# Patient Record
Sex: Male | Born: 1965 | Race: Black or African American | Hispanic: No | Smoking: Current every day smoker
Health system: Southern US, Community
[De-identification: ages and names within clinical notes are randomized; demographics above are authoritative.]

## PROBLEM LIST (undated history)

## (undated) DIAGNOSIS — M1990 Unspecified osteoarthritis, unspecified site: Secondary | ICD-10-CM

## (undated) DIAGNOSIS — G709 Myoneural disorder, unspecified: Secondary | ICD-10-CM

## (undated) DIAGNOSIS — I10 Essential (primary) hypertension: Secondary | ICD-10-CM

## (undated) HISTORY — DX: Myoneural disorder, unspecified: G70.9

## (undated) HISTORY — PX: KNEE SURGERY: SHX244

## (undated) HISTORY — PX: FRACTURE SURGERY: SHX138

## (undated) HISTORY — DX: Unspecified osteoarthritis, unspecified site: M19.90

---

## 2013-08-13 ENCOUNTER — Encounter (HOSPITAL_COMMUNITY): Payer: Self-pay | Admitting: Emergency Medicine

## 2013-08-13 ENCOUNTER — Emergency Department (HOSPITAL_COMMUNITY): Payer: TRICARE For Life (TFL)

## 2013-08-13 ENCOUNTER — Emergency Department (HOSPITAL_COMMUNITY)
Admission: EM | Admit: 2013-08-13 | Discharge: 2013-08-13 | Disposition: A | Discharge status: Home / Self Care | Payer: TRICARE For Life (TFL) | Attending: Emergency Medicine | Admitting: Emergency Medicine

## 2013-08-13 DIAGNOSIS — Y9289 Other specified places as the place of occurrence of the external cause: Secondary | ICD-10-CM | POA: Insufficient documentation

## 2013-08-13 DIAGNOSIS — Y9389 Activity, other specified: Secondary | ICD-10-CM | POA: Insufficient documentation

## 2013-08-13 DIAGNOSIS — S61209A Unspecified open wound of unspecified finger without damage to nail, initial encounter: Secondary | ICD-10-CM | POA: Insufficient documentation

## 2013-08-13 DIAGNOSIS — S61409A Unspecified open wound of unspecified hand, initial encounter: Secondary | ICD-10-CM | POA: Insufficient documentation

## 2013-08-13 DIAGNOSIS — W268XXA Contact with other sharp object(s), not elsewhere classified, initial encounter: Secondary | ICD-10-CM | POA: Insufficient documentation

## 2013-08-13 DIAGNOSIS — F17200 Nicotine dependence, unspecified, uncomplicated: Secondary | ICD-10-CM | POA: Insufficient documentation

## 2013-08-13 DIAGNOSIS — Z23 Encounter for immunization: Secondary | ICD-10-CM | POA: Insufficient documentation

## 2013-08-13 DIAGNOSIS — I10 Essential (primary) hypertension: Secondary | ICD-10-CM | POA: Insufficient documentation

## 2013-08-13 DIAGNOSIS — S61218A Laceration without foreign body of other finger without damage to nail, initial encounter: Secondary | ICD-10-CM

## 2013-08-13 HISTORY — DX: Essential (primary) hypertension: I10

## 2013-08-13 MED ORDER — HYDROCODONE-ACETAMINOPHEN 5-325 MG PO TABS: 2.0000 | ORAL_TABLET | ORAL | Status: DC | PRN

## 2013-08-13 MED ORDER — AMOXICILLIN-POT CLAVULANATE 875-125 MG PO TABS: 1.0000 | ORAL_TABLET | Freq: Two times a day (BID) | ORAL | Status: DC

## 2013-08-13 MED ORDER — TETANUS-DIPHTH-ACELL PERTUSSIS 5-2.5-18.5 LF-MCG/0.5 IM SUSP
0.5000 mL | Freq: Once | INTRAMUSCULAR | Status: AC
  Administered 2013-08-13: 0.5 mL via INTRAMUSCULAR
  Filled 2013-08-13: qty 0.5

## 2013-08-13 MED ORDER — LIDOCAINE HCL (PF) 1 % IJ SOLN
30.0000 mL | Freq: Once | INTRAMUSCULAR | Status: DC
  Filled 2013-08-13: qty 30
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
## 2013-08-13 NOTE — ED Provider Notes (Signed)
Medical screening examination/treatment/procedure(s) were conducted as a shared visit with non-physician practitioner(s) and myself.  I personally evaluated the patient during the encounter.   EKG Interpretation None       Glynn OctaveStephen Jemaine Prokop, MD 08/13/13 1732

## 2013-08-13 NOTE — ED Notes (Signed)
Applied gauze to pt's finger and splinted finger

## 2013-08-13 NOTE — ED Notes (Signed)
Pt discharged home with all her belongings, pt alert, orient and ambulatory upon discharge, 2 new rx prescribed, pt verbalizes understanding of discharge instructions, pt driven home by friend at bedside
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
## 2013-08-13 NOTE — Discharge Instructions (Signed)
Laceration Care, Adult Call Dr. Janee Mornhompson today for an appointment tomorrow. Return to the ED if you develop new or worsening symptoms. A laceration is a cut or lesion that goes through all layers of the skin and into the tissue just beneath the skin. TREATMENT  Some lacerations may not require closure. Some lacerations may not be able to be closed due to an increased risk of infection. It is important to see your caregiver as soon as possible after an injury to minimize the risk of infection and maximize the opportunity for successful closure. If closure is appropriate, pain medicines may be given, if needed. The wound will be cleaned to help prevent infection. Your caregiver will use stitches (sutures), staples, wound glue (adhesive), or skin adhesive strips to repair the laceration. These tools bring the skin edges together to allow for faster healing and a better cosmetic outcome. However, all wounds will heal with a scar. Once the wound has healed, scarring can be minimized by covering the wound with sunscreen during the day for 1 full year. HOME CARE INSTRUCTIONS  For sutures or staples:  Keep the wound clean and dry.  If you were given a bandage (dressing), you should change it at least once a day. Also, change the dressing if it becomes wet or dirty, or as directed by your caregiver.  Wash the wound with soap and water 2 times a day. Rinse the wound off with water to remove all soap. Pat the wound dry with a clean towel.  After cleaning, apply a thin layer of the antibiotic ointment as recommended by your caregiver. This will help prevent infection and keep the dressing from sticking.  You may shower as usual after the first 24 hours. Do not soak the wound in water until the sutures are removed.  Only take over-the-counter or prescription medicines for pain, discomfort, or fever as directed by your caregiver.  Get your sutures or staples removed as directed by your caregiver. For skin  adhesive strips:  Keep the wound clean and dry.  Do not get the skin adhesive strips wet. You may bathe carefully, using caution to keep the wound dry.  If the wound gets wet, pat it dry with a clean towel.  Skin adhesive strips will fall off on their own. You may trim the strips as the wound heals. Do not remove skin adhesive strips that are still stuck to the wound. They will fall off in time. For wound adhesive:  You may briefly wet your wound in the shower or bath. Do not soak or scrub the wound. Do not swim. Avoid periods of heavy perspiration until the skin adhesive has fallen off on its own. After showering or bathing, gently pat the wound dry with a clean towel.  Do not apply liquid medicine, cream medicine, or ointment medicine to your wound while the skin adhesive is in place. This may loosen the film before your wound is healed.  If a dressing is placed over the wound, be careful not to apply tape directly over the skin adhesive. This may cause the adhesive to be pulled off before the wound is healed.  Avoid prolonged exposure to sunlight or tanning lamps while the skin adhesive is in place. Exposure to ultraviolet light in the first year will darken the scar.  The skin adhesive will usually remain in place for 5 to 10 days, then naturally fall off the skin. Do not pick at the adhesive film. You may need a tetanus shot  if:  You cannot remember when you had your last tetanus shot.  You have never had a tetanus shot. If you get a tetanus shot, your arm may swell, get red, and feel warm to the touch. This is common and not a problem. If you need a tetanus shot and you choose not to have one, there is a rare chance of getting tetanus. Sickness from tetanus can be serious. SEEK MEDICAL CARE IF:   You have redness, swelling, or increasing pain in the wound.  You see a red line that goes away from the wound.  You have yellowish-white fluid (pus) coming from the wound.  You have  a fever.  You notice a bad smell coming from the wound or dressing.  Your wound breaks open before or after sutures have been removed.  You notice something coming out of the wound such as wood or glass.  Your wound is on your hand or foot and you cannot move a finger or toe. SEEK IMMEDIATE MEDICAL CARE IF:   Your pain is not controlled with prescribed medicine.  You have severe swelling around the wound causing pain and numbness or a change in color in your arm, hand, leg, or foot.  Your wound splits open and starts bleeding.  You have worsening numbness, weakness, or loss of function of any joint around or beyond the wound.  You develop painful lumps near the wound or on the skin anywhere on your body. MAKE SURE YOU:   Understand these instructions.  Will watch your condition.  Will get help right away if you are not doing well or get worse. Document Released: 03/22/2005 Document Revised: 06/14/2011 Document Reviewed: 09/15/2010 [facility] Patient Information 2014 [place], [place].

## 2013-08-13 NOTE — ED Provider Notes (Signed)
I was asked by Dr. Manus Gunningancour to repair lacerations to the left index finger and left thumb.  No evidence of tendon involvement.  No obvious foreign body on visual exam and with exploration.  LACERATION REPAIR Performed by: Roxy Horsemanobert Kani Chauvin Authorized by: Roxy Horsemanobert Todd Argabright Consent: Verbal consent obtained. Risks and benefits: risks, benefits and alternatives were discussed Consent given by: patient Patient identity confirmed: provided demographic data Prepped and Draped in normal sterile fashion Wound explored  Laceration Location: left index  Laceration Length: 2 cm  No Foreign Bodies seen or palpated  Anesthesia: digital block  Local anesthetic: lidocaine 2% without epinephrine  Anesthetic total: 4 ml  Irrigation method: syringe Amount of cleaning: standard  Skin closure: 4-0 chromic absorbable  Number of sutures: 2   Technique: interrupted loose  Patient tolerance: Patient tolerated the procedure well with no immediate complications.  LACERATION REPAIR Performed by: Roxy Horsemanobert Zareena Willis Authorized by: Roxy Horsemanobert Hester Forget Consent: Verbal consent obtained. Risks and benefits: risks, benefits and alternatives were discussed Consent given by: patient Patient identity confirmed: provided demographic data Prepped and Draped in normal sterile fashion Wound explored  Laceration Location: left palm  Laceration Length: 1 cm  No Foreign Bodies seen or palpated  Anesthesia: local infiltration  Local anesthetic: lidocaine 2% without epinephrine  Anesthetic total: 2 ml  Irrigation method: syringe Amount of cleaning: standard  Skin closure: 4-0 chromic  Number of sutures: 2  Technique: interrupted  Patient tolerance: Patient tolerated the procedure well with no immediate complications.   Roxy Horsemanobert Taylie Helder, PA-C 08/13/13 [phone]

## 2013-10-18 ENCOUNTER — Ambulatory Visit (INDEPENDENT_AMBULATORY_CARE_PROVIDER_SITE_OTHER): Admitting: Family Medicine

## 2013-10-18 VITALS — BP 130/82 | HR 88 | Temp 98.7°F | Resp 20 | Ht 71.5 in | Wt 218.0 lb

## 2013-10-18 DIAGNOSIS — L02419 Cutaneous abscess of limb, unspecified: Secondary | ICD-10-CM

## 2013-10-18 DIAGNOSIS — L03119 Cellulitis of unspecified part of limb: Secondary | ICD-10-CM

## 2013-10-18 DIAGNOSIS — L03115 Cellulitis of right lower limb: Secondary | ICD-10-CM

## 2013-10-18 MED ORDER — AMOXICILLIN-POT CLAVULANATE 875-125 MG PO TABS: 1.0000 | ORAL_TABLET | Freq: Two times a day (BID) | ORAL | Status: DC

## 2013-10-18 MED ORDER — NAPROXEN 500 MG PO TABS: 500.0000 mg | ORAL_TABLET | Freq: Two times a day (BID) | ORAL | Status: DC

## 2013-10-18 NOTE — Progress Notes (Signed)
Subjective: For last couple days patient has developed redness and discomfort in his right lower extremity. Knows of no injury or sting. He has not had this before.  Objective: He has a cellulitis appearance in the right lower leg slightly medially, a little red and tender descending redness. I am not certain of being able to palpate a cord in the superficial veins. He does have mild right groin tenderness but no major enlargement of the nodes. No calf or medial thigh tenderness and negative Homans. There is an older appearing excoriations. It is healed almost.  Assessment: Cellulitis right leg. It could be a superficial phlebitis.  Plan: Augmentin Naproxen  Return if worse See instructions

## 2013-10-18 NOTE — Progress Notes (Deleted)
   Subjective:    Patient ID: Kenneth Vaughn, male    DOB: 1965/10/26, 48 y.o.   MRN: [id number]  HPI    Review of Systems     Objective:   Physical Exam        Assessment & Plan:

## 2013-10-18 NOTE — Patient Instructions (Signed)
Elevate the leg as much of the time as possible  Apply warm compress for 5 times daily for about 10 or 15 minutes  Take Augmentin(ampicillin/clavulanate) one twice daily for infection  Take naproxen one twice daily for pain and inflammation  Return if worse or not improving over the next few days

## 2013-11-05 ENCOUNTER — Other Ambulatory Visit: Payer: Self-pay | Admitting: Family Medicine

## 2013-11-06 NOTE — Telephone Encounter (Signed)
Do you want to give RF or RTC? 

## 2014-01-03 ENCOUNTER — Ambulatory Visit (INDEPENDENT_AMBULATORY_CARE_PROVIDER_SITE_OTHER): Admitting: Urgent Care

## 2014-01-03 VITALS — BP 132/84 | HR 92 | Temp 97.9°F | Resp 18 | Ht 71.0 in | Wt 218.0 lb

## 2014-01-03 DIAGNOSIS — H60391 Other infective otitis externa, right ear: Secondary | ICD-10-CM

## 2014-01-03 MED ORDER — NEOMYCIN-POLYMYXIN-HC 1 % OT SOLN
3.0000 [drp] | Freq: Four times a day (QID) | OTIC | Status: AC
Start: 2014-01-03 — End: 2014-01-10

## 2014-01-03 NOTE — Patient Instructions (Signed)
Please return to clinic if ear wick falls out before 3 days.   Otitis Externa Otitis externa is a bacterial or fungal infection of the outer ear canal. This is the area from the eardrum to the outside of the ear. Otitis externa is sometimes called "swimmer's ear." CAUSES  Possible causes of infection include:  Swimming in dirty water.  Moisture remaining in the ear after swimming or bathing.  Mild injury (trauma) to the ear.  Objects stuck in the ear (foreign body).  Cuts or scrapes (abrasions) on the outside of the ear. SIGNS AND SYMPTOMS  The first symptom of infection is often itching in the ear canal. Later signs and symptoms may include swelling and redness of the ear canal, ear pain, and yellowish-white fluid (pus) coming from the ear. The ear pain may be worse when pulling on the earlobe. DIAGNOSIS  Your health care provider will perform a physical exam. A sample of fluid may be taken from the ear and examined for bacteria or fungi. TREATMENT  Antibiotic ear drops are often given for 10 to 14 days. Treatment may also include pain medicine or corticosteroids to reduce itching and swelling. HOME CARE INSTRUCTIONS   Apply antibiotic ear drops to the ear canal as prescribed by your health care provider.  Take medicines only as directed by your health care provider.  If you have diabetes, follow any additional treatment instructions from your health care provider.  Keep all follow-up visits as directed by your health care provider. PREVENTION   Keep your ear dry. Use the corner of a towel to absorb water out of the ear canal after swimming or bathing.  Avoid scratching or putting objects inside your ear. This can damage the ear canal or remove the protective wax that lines the canal. This makes it easier for bacteria and fungi to grow.  Avoid swimming in lakes, polluted water, or poorly chlorinated pools.  You may use ear drops made of rubbing alcohol and vinegar after  swimming. Combine equal parts of white vinegar and alcohol in a bottle. Put 3 or 4 drops into each ear after swimming. SEEK MEDICAL CARE IF:   You have a fever.  Your ear is still red, swollen, painful, or draining pus after 3 days.  Your redness, swelling, or pain gets worse.  You have a severe headache.  You have redness, swelling, pain, or tenderness in the area behind your ear. MAKE SURE YOU:   Understand these instructions.  Will watch your condition.  Will get help right away if you are not doing well or get worse. Document Released: 03/22/2005 Document Revised: 08/06/2013 Document Reviewed: 04/08/2011 [facility] Patient Information 2015 [place], [place]. This information is not intended to replace advice given to you by your health care provider. Make sure you discuss any questions you have with your health care provider.

## 2014-01-03 NOTE — Progress Notes (Signed)
I was directly involved with the patient's care and agree with the physical, diagnosis and treatment plan.  

## 2014-01-03 NOTE — Progress Notes (Signed)
   Subjective:    Patient ID: Kenneth Vaughn, male    DOB: 15-Feb-1966, 48 y.o.   MRN: [id number]  HPI  Patient is a 48 y.o. male presenting for a 3 day history of progressively worsening right sided ear pain. Patient reports that his pain began without an inciting event, is constant and worse with palpation. Associated symptoms included right-sided hearing loss, tinnitus, and mild green-colored drainage at night (seen in the morning on a cotton ball he has been using at night), Patient also admits a slight headache and mild scratchy throat. He used ibuprofen x 1 without any relief, no other OTC medications. Denies fevers, chills, sinus pain, congestion, rhinorrhea, vision changes, cough. Of note, he has no been swimming recently, has not had any sick contacts and does not use Q-tips to clean his ears, uses cotton swabs. Denies any other aggravating or relieving factors.   Review of Systems As in subjective.    Objective:   Physical Exam  Vitals reviewed. Constitutional: He is oriented to person, place, and time. He appears well-developed and well-nourished. No distress.  BP 132/84  Pulse 92  Temp(Src) 97.9 F (36.6 C) (Oral)  Resp 18  Ht 5\' 11"  (1.803 m)  Wt 218 lb (98.884 kg)  BMI 30.42 kg/m2  SpO2 98%   HENT:  Head: Normocephalic and atraumatic.  Right Ear: There is drainage (mild, clear), swelling (TM not visualized secondary to swelling) and tenderness (tender with manipulation of tragus, pinna). Decreased hearing is noted.  Left Ear: Tympanic membrane, external ear and ear canal normal. No drainage.  Nose: Nose normal.  Mouth/Throat: Oropharynx is clear and moist. No oropharyngeal exudate.  Eyes: Conjunctivae are normal. Right eye exhibits no discharge. Left eye exhibits no discharge. No scleral icterus.  Pulmonary/Chest: Effort normal.  Neurological: He is alert and oriented to person, place, and time.  Skin: Skin is warm and dry.  Psychiatric: He has a normal mood and  affect. His behavior is normal.       Assessment & Plan:   1. Otitis, externa, infective, right Suspected bacterial infection, Rx Cortisporin HC w/ear wick placement; consider fungal etiology if symptoms not resolved/worsen  - NEOMYCIN-POLYMYXIN-HYDROCORTISONE (CORTISPORIN) 1 % SOLN otic solution; Place 3 drops into the right ear 4 (four) times daily.  Dispense: 10 mL; Refill: 0

## 2014-07-24 ENCOUNTER — Encounter (HOSPITAL_COMMUNITY): Payer: Self-pay | Admitting: Family Medicine

## 2014-07-24 ENCOUNTER — Emergency Department (INDEPENDENT_AMBULATORY_CARE_PROVIDER_SITE_OTHER)
Admission: EM | Admit: 2014-07-24 | Discharge: 2014-07-24 | Disposition: A | Discharge status: Home / Self Care | Payer: TRICARE For Life (TFL) | Source: Home / Self Care | Attending: Family Medicine | Admitting: Family Medicine

## 2014-07-24 DIAGNOSIS — Z872 Personal history of diseases of the skin and subcutaneous tissue: Secondary | ICD-10-CM

## 2014-07-24 DIAGNOSIS — L03317 Cellulitis of buttock: Secondary | ICD-10-CM | POA: Diagnosis not present

## 2014-07-24 MED ORDER — KETOROLAC TROMETHAMINE 60 MG/2ML IM SOLN
INTRAMUSCULAR | Status: AC
  Filled 2014-07-24: qty 2

## 2014-07-24 MED ORDER — KETOROLAC TROMETHAMINE 60 MG/2ML IM SOLN
60.0000 mg | Freq: Once | INTRAMUSCULAR | Status: AC
  Administered 2014-07-24: 60 mg via INTRAMUSCULAR

## 2014-07-24 MED ORDER — CEPHALEXIN 500 MG PO CAPS: 500.0000 mg | ORAL_CAPSULE | Freq: Three times a day (TID) | ORAL | Status: DC

## 2014-07-24 NOTE — ED Notes (Signed)
C/o pain on buttocks, poss abscess

## 2014-07-24 NOTE — ED Provider Notes (Signed)
CSN: [id number]     Arrival date & time 07/24/14  0909 History   First MD Initiated Contact with Patient 07/24/14 1022     Chief Complaint  Patient presents with  . Skin Problem   (Consider location/radiation/quality/duration/timing/severity/associated sxs/prior Treatment) HPI  3-4 days ago developed a painful bump on bottom. IN the middle. Tried warm compresses w/o relief. Getting worse. Constant. Unable to sleep. Hydrocodone w/o benefit. No drainage.  Denies fevers, CP, sob, palpitations, syncope, HA, abd pain, dysuria, frequency, back pain.   Numerous lumps under the skin throughout face back. Constant. Present for years. Continues to get new spots. Nontender, no discharge. Nothing makes these better.   Past Medical History  Diagnosis Date  . Hypertension   . Arthritis   . Neuromuscular disorder    Past Surgical History  Procedure Laterality Date  . Knee surgery      right and left  . Fracture surgery     Family History  Problem Relation Age of Onset  . Diabetes Mother   . Hypertension Father   . Diabetes Sister   . Diabetes Maternal Grandmother   . Stroke Maternal Grandmother   . Cancer Maternal Grandfather   . Stroke Paternal Grandmother   . Heart disease Paternal Grandfather    History  Substance Use Topics  . Smoking status: Current Every Day Smoker -- 1.00 packs/day    Types: Cigarettes  . Smokeless tobacco: Not on file  . Alcohol Use: Yes    Review of Systems Per HPI with all other pertinent systems negative.   Allergies  Review of patient's allergies indicates no known allergies.  Home Medications   Prior to Admission medications   Medication Sig Start Date End Date Taking? Authorizing Provider  cephALEXin (KEFLEX) 500 MG capsule Take 1 capsule (500 mg total) by mouth 3 (three) times daily. 07/24/14   Ozella Rocks, MD  meloxicam (MOBIC) 7.5 MG tablet Take 7.5 mg by mouth daily as needed for pain.    Historical Provider, MD   BP 141/98 mmHg   Pulse 115  Temp(Src) 98.4 F (36.9 C) (Oral)  Resp 16  SpO2 97% Physical Exam Physical Exam  Constitutional: oriented to person, place, and time. appears well-developed and well-nourished. No distress.  HENT:  Head: Normocephalic and atraumatic.  Eyes: EOMI. PERRL.  Neck: Normal range of motion.  Cardiovascular: RRR, no m/r/g, 2+ distal pulses,  Pulmonary/Chest: Effort normal and breath sounds normal. No respiratory distress.  Abdominal: Soft. Bowel sounds are normal. NonTTP, no distension.  Musculoskeletal: Normal range of motion. Non ttp, no effusion.  Neurological: alert and oriented to person, place, and time.  Skin: 7 x 4 cm area of indurated skin in the left proximal gluteal cleft without definitive area of fluctuance, no discharge. Very tender to palpation. Numerous pea-sized to 2.5 cm fluid-filled freely movable lesions throughout face, back, and arms.  Psychiatric: normal mood and affect. behavior is normal. Judgment and thought content normal.   ED Course  Procedures (including critical care time) Labs Review Labs Reviewed - No data to display  Imaging Review No results found.   MDM   1. Cellulitis of buttock, left   2. H/O sebaceous cyst    Toradol 60 mg IM administered for pain and inflammation. Start Keflex. Will treat the cellulitis for an extended period of time as there is concern for a possible abscess but nothing definitive to be up to drain at this time. Patient aware that if this therapy does not work  he will need to come back for possible incision and drainage. Again no appreciable area of fluctuance at this period of time. Patient inquiring as to the removal of numerous cysts. His has these done in the past. For an patient back to his primary care physician or dermatologist for these to be done. Reminded patient of the benign nature of these lesions and how he typically require some secondary competition such as infection or inflammation or pain noted at the  insurance to pay for the removal. Patient will follow up with his PCP as necessary.    Ozella Rocksavid J Merrell, MD 07/24/14 [phone]

## 2014-07-24 NOTE — Discharge Instructions (Signed)
You have developed a skin infection called cellulitis. It is unclear whether or not there is an abscess. Please start the antibiotics and take them for the full 10 days. If your infection comes back it will be because there was an abscess and this will need to be drained. Please continue the warm soaks until your pain has resolved or until your infection comes to a head with drainage. Please use ibuprofen or Advil for pain, but start this in 24 hours. Please follow-up with your primary care physician with regards to the numerous cysts in having them removed. Remember that in order for insurance to pay for these are typically needs to be evidence of infection, inflammation, or pain.

## 2014-11-25 ENCOUNTER — Emergency Department (HOSPITAL_BASED_OUTPATIENT_CLINIC_OR_DEPARTMENT_OTHER)
Admission: EM | Admit: 2014-11-25 | Discharge: 2014-11-25 | Disposition: A | Discharge status: Home / Self Care | Attending: Emergency Medicine | Admitting: Emergency Medicine

## 2014-11-25 ENCOUNTER — Emergency Department (HOSPITAL_BASED_OUTPATIENT_CLINIC_OR_DEPARTMENT_OTHER)

## 2014-11-25 ENCOUNTER — Encounter (HOSPITAL_BASED_OUTPATIENT_CLINIC_OR_DEPARTMENT_OTHER): Payer: Self-pay

## 2014-11-25 DIAGNOSIS — Z8739 Personal history of other diseases of the musculoskeletal system and connective tissue: Secondary | ICD-10-CM | POA: Insufficient documentation

## 2014-11-25 DIAGNOSIS — H00034 Abscess of left upper eyelid: Secondary | ICD-10-CM | POA: Diagnosis not present

## 2014-11-25 DIAGNOSIS — H02844 Edema of left upper eyelid: Secondary | ICD-10-CM | POA: Diagnosis present

## 2014-11-25 DIAGNOSIS — Z720 Tobacco use: Secondary | ICD-10-CM | POA: Diagnosis not present

## 2014-11-25 DIAGNOSIS — I10 Essential (primary) hypertension: Secondary | ICD-10-CM | POA: Diagnosis not present

## 2014-11-25 DIAGNOSIS — L0291 Cutaneous abscess, unspecified: Secondary | ICD-10-CM

## 2014-11-25 LAB — BASIC METABOLIC PANEL
Anion gap: 8 (ref 5–15)
BUN: 16 mg/dL (ref 6–20)
CO2: 24 mmol/L (ref 22–32)
Calcium: 9.1 mg/dL (ref 8.9–10.3)
Chloride: 104 mmol/L (ref 101–111)
Creatinine, Ser: 0.94 mg/dL (ref 0.61–1.24)
GFR calc Af Amer: >60 mL/min (ref >60)
GFR calc non Af Amer: >60 mL/min (ref >60)
GLUCOSE: 107 mg/dL — AB (ref 65–99)
Potassium: 3.9 mmol/L (ref 3.5–5.1)
Sodium: 136 mmol/L (ref 135–145)

## 2014-11-25 LAB — CBC WITH DIFFERENTIAL/PLATELET
BASOS PCT: 0 % (ref 0–1)
Basophils Absolute: 0 10*3/uL (ref 0.0–0.1)
EOS PCT: 4 % (ref 0–5)
Eosinophils Absolute: 0.5 10*3/uL (ref 0.0–0.7)
HCT: 42.6 % (ref 39.0–52.0)
Hemoglobin: 14.9 g/dL (ref 13.0–17.0)
Lymphocytes Relative: 18 % (ref 12–46)
Lymphs Abs: 2.1 10*3/uL (ref 0.7–4.0)
MCH: 30.8 pg (ref 26.0–34.0)
MCHC: 35 g/dL (ref 30.0–36.0)
MCV: 88 fL (ref 78.0–100.0)
MONO ABS: 1.5 10*3/uL — AB (ref 0.1–1.0)
Monocytes Relative: 13 % — ABNORMAL HIGH (ref 3–12)
Neutro Abs: 7.7 10*3/uL (ref 1.7–7.7)
Neutrophils Relative %: 65 % (ref 43–77)
PLATELETS: 178 10*3/uL (ref 150–400)
RBC: 4.84 MIL/uL (ref 4.22–5.81)
RDW: 13.2 % (ref 11.5–15.5)
WBC: 11.8 10*3/uL — ABNORMAL HIGH (ref 4.0–10.5)

## 2014-11-25 MED ORDER — ONDANSETRON HCL 8 MG PO TABS
8.0000 mg | ORAL_TABLET | Freq: Once | ORAL | Status: AC
  Administered 2014-11-25: 8 mg via ORAL
  Filled 2014-11-25: qty 1

## 2014-11-25 MED ORDER — SULFAMETHOXAZOLE-TRIMETHOPRIM 800-160 MG PO TABS
1.0000 | ORAL_TABLET | Freq: Once | ORAL | Status: AC
  Administered 2014-11-25: 1 via ORAL
  Filled 2014-11-25: qty 1

## 2014-11-25 MED ORDER — IOHEXOL 300 MG/ML  SOLN
100.0000 mL | Freq: Once | INTRAMUSCULAR | Status: AC | PRN
  Administered 2014-11-25: 100 mL via INTRAVENOUS

## 2014-11-25 MED ORDER — CEPHALEXIN 500 MG PO CAPS: 500.0000 mg | ORAL_CAPSULE | Freq: Four times a day (QID) | ORAL | Status: AC

## 2014-11-25 MED ORDER — HYDROMORPHONE HCL 1 MG/ML IJ SOLN
0.5000 mg | Freq: Once | INTRAMUSCULAR | Status: AC
  Administered 2014-11-25: 0.5 mg via INTRAVENOUS
  Filled 2014-11-25: qty 1

## 2014-11-25 NOTE — ED Provider Notes (Signed)
EMERGENCY DEPARTMENT US SOFT TISSUE INTERPRETATION "Study: Limited Ultrasound of the noted body part in comments below"  INDICATIONS: Soft tissue infection Multiple views of the body part are obtained with a multi-frequency linear probe  PERFORMED BY:  Myself  IMAGES ARCHIVED?: Yes  SIDE:Left  BODY PART: L upper eyelid  FINDINGS: Abcess present  LIMITATIONS:  none  INTERPRETATION:  Abcess present  COMMENT:     Renne Crigler, PA-C 11/25/14 1255  Gerhard Munch, MD 11/25/14 1527

## 2014-11-25 NOTE — ED Notes (Signed)
MD at bedside. 

## 2014-11-25 NOTE — ED Notes (Signed)
Swelling to left upper eye lid since 9/19-denies injury

## 2014-11-25 NOTE — ED Provider Notes (Signed)
CSN: [id number]     Arrival date & time 11/25/14  1059 History   First MD Initiated Contact with Patient 11/25/14 1105     Chief Complaint  Patient presents with  . Eye Problem     (Consider location/radiation/quality/duration/timing/severity/associated sxs/prior Treatment) HPI Comments: Pt is a 49 yo male who presents to the ED with complaint of left upper eyelid swelling, onset 3 days. Pt reports he first noticed 2 pustules and one small bump on his eyelid. He notes he tried to pop the bump but was unable to produce any drainage. He notes the swelling has worsened over the weekend and his eye is now swollen shut. Endorses matting of left eye. Pt has used bacitracin ointment at home without relief. He reports 9/10 pain to left eye. Denies fever, headache, abdominal pain, N/V, numbness, tingling. Denies known insect bite or injury to left eye.  Patient is a 49 y.o. male presenting with eye problem.  Eye Problem Associated symptoms: discharge     Past Medical History  Diagnosis Date  . Hypertension   . Arthritis   . Neuromuscular disorder    Past Surgical History  Procedure Laterality Date  . Knee surgery      right and left  . Fracture surgery     Family History  Problem Relation Age of Onset  . Diabetes Mother   . Hypertension Father   . Diabetes Sister   . Diabetes Maternal Grandmother   . Stroke Maternal Grandmother   . Cancer Maternal Grandfather   . Stroke Paternal Grandmother   . Heart disease Paternal Grandfather    Social History  Substance Use Topics  . Smoking status: Current Every Day Smoker -- 1.00 packs/day    Types: Cigarettes  . Smokeless tobacco: None  . Alcohol Use: Yes    Review of Systems  Eyes: Positive for pain and discharge.  All other systems reviewed and are negative.     Allergies  Review of patient's allergies indicates no known allergies.  Home Medications   Prior to Admission medications   Medication Sig Start Date End Date  Taking? Authorizing Provider  cephALEXin (KEFLEX) 500 MG capsule Take 1 capsule (500 mg total) by mouth 4 (four) times daily. 11/25/14   Satira Sark Rolande Moe, PA-C   BP 140/96 mmHg  Pulse 95  Temp(Src) 97.8 F (36.6 C) (Oral)  Resp 16  Ht 5\' 11"  (1.803 m)  Wt 220 lb (99.791 kg)  BMI 30.70 kg/m2 Physical Exam  Constitutional: He is oriented to person, place, and time. He appears well-developed and well-nourished.  HENT:  Head: Normocephalic and atraumatic.  Nose: Right sinus exhibits no maxillary sinus tenderness and no frontal sinus tenderness. Left sinus exhibits no maxillary sinus tenderness and no frontal sinus tenderness.  Eyes: Conjunctivae and EOM are normal. Pupils are equal, round, and reactive to light. No scleral icterus.  Moderate swelling to left upper eyelid, unable to visualize left eye completely due to amount of swelling, 2x1.5cm abscess with fluctuance and erythema noted to center of left upper eyelid, no drainage, chemosis at left conjunctiva. Unable to complete eye exam due to amount of swelling present. Swelling does not extend further than upper eyelid.  Pulmonary/Chest: Effort normal.  Neurological: He is alert and oriented to person, place, and time.    ED Course  Procedures (including critical care time) Labs Review Labs Reviewed  BASIC METABOLIC PANEL - Abnormal; Notable for the following:    Glucose, Bld 107 (*)    All  other components within normal limits  CBC WITH DIFFERENTIAL/PLATELET - Abnormal; Notable for the following:    WBC 11.8 (*)    Monocytes Relative 13 (*)    Monocytes Absolute 1.5 (*)    All other components within normal limits    Imaging Review Ct Orbits W/cm  11/25/2014   CLINICAL DATA:  Patient's left eye is read and swelling. Patient denies trauma or insect bites.  EXAM: CT ORBITS WITH CONTRAST  TECHNIQUE: Multidetector CT imaging of the orbits was performed following the bolus administration of intravenous contrast.  CONTRAST:   OMNIPAQUE IOHEXOL 300 MG/ML  SOLN  COMPARISON:  None.  FINDINGS: The globes and extraocular muscles appear symmetrical. There is soft tissue swelling overlying the left globe with small hypoattenuated fluid collection subcutaneously which measures approximately 6 mm in diameter. This likely represents a superficial abscess. There is no evidence of postseptal inflammatory changes.  No air fluid levels in the paranasal sinuses. There is mild polypoid mucosal thickening of the maxillary sinuses and ethmoid air cells bilaterally.  The frontal bones, orbital rims, maxillary antral walls, nasal bones, nasal septum, nasal spine, maxilla, pterygoid plates, zygomatic arches, temporomandibular joints, and mandible appear intact.  No displaced fractures are identified.  IMPRESSION: Soft tissue swelling with 6 mm subcutaneous abscess overlying the left globe. No evidence of postseptal abscess.  Minimal polypoid mucosal thickening of the maxillary sinuses and ethmoid air cells.   Electronically Signed   By: Ted Mcalpine M.D.   On: 11/25/2014 14:21   I have personally reviewed and evaluated these images and lab results as part of my medical decision-making.  Filed Vitals:   11/25/14 1111  BP: 140/96  Pulse: 95  Temp: 97.8 F (36.6 C)  Resp: 16   Meds given in ED:  Medications  sulfamethoxazole-trimethoprim (BACTRIM DS,SEPTRA DS) 800-160 MG per tablet 1 tablet (1 tablet Oral Given 11/25/14 1307)  HYDROmorphone (DILAUDID) injection 0.5 mg (0.5 mg Intravenous Given 11/25/14 1307)  ondansetron (ZOFRAN) tablet 8 mg (8 mg Oral Given 11/25/14 1307)  iohexol (OMNIPAQUE) 300 MG/ML solution 100 mL (100 mLs Intravenous Contrast Given 11/25/14 1349)    New Prescriptions   CEPHALEXIN (KEFLEX) 500 MG CAPSULE    Take 1 capsule (500 mg total) by mouth 4 (four) times daily.     MDM   Final diagnoses:  Abscess    Pt presents with worsening swelling to left upper eyelid. Denies fever.  US performed by Rhea Bleacher, PA-C. US revealed appx. 2x2cm abscess located at center of left upper eyelid. CT orbit with contrast ordered to evaluate for orbital cellulitis.  Pt reports increased pain. Pt given IV dilaudid for pain control. Abx started in ED, pt given bactrim.  CT reveals soft tissue swelling with 6mm subcutaneous abscess.  Ophthalmologist consulted. Advised to send pt to their office to be seen and to give pt rx for keflex.   Discussed results and plan for d/c and immediate follow up with Dr. Cathey Endow in their office this afternoon. Pt given rx for Keflex. Pt agrees with plan.    Satira Sark [place], [place] 11/25/14 1453  Gerhard Munch, MD 11/25/14 [phone]

## 2014-11-25 NOTE — ED Notes (Signed)
Patient transported to CT 

## 2015-08-25 IMAGING — CR DG FINGER INDEX 2+V*L*
3 series · 3 of 3 positions shown · non-contrast
Comparison: None.

CLINICAL DATA: Laceration from a bottle

EXAM:
LEFT INDEX FINGER 2+V

[x finger pa left]
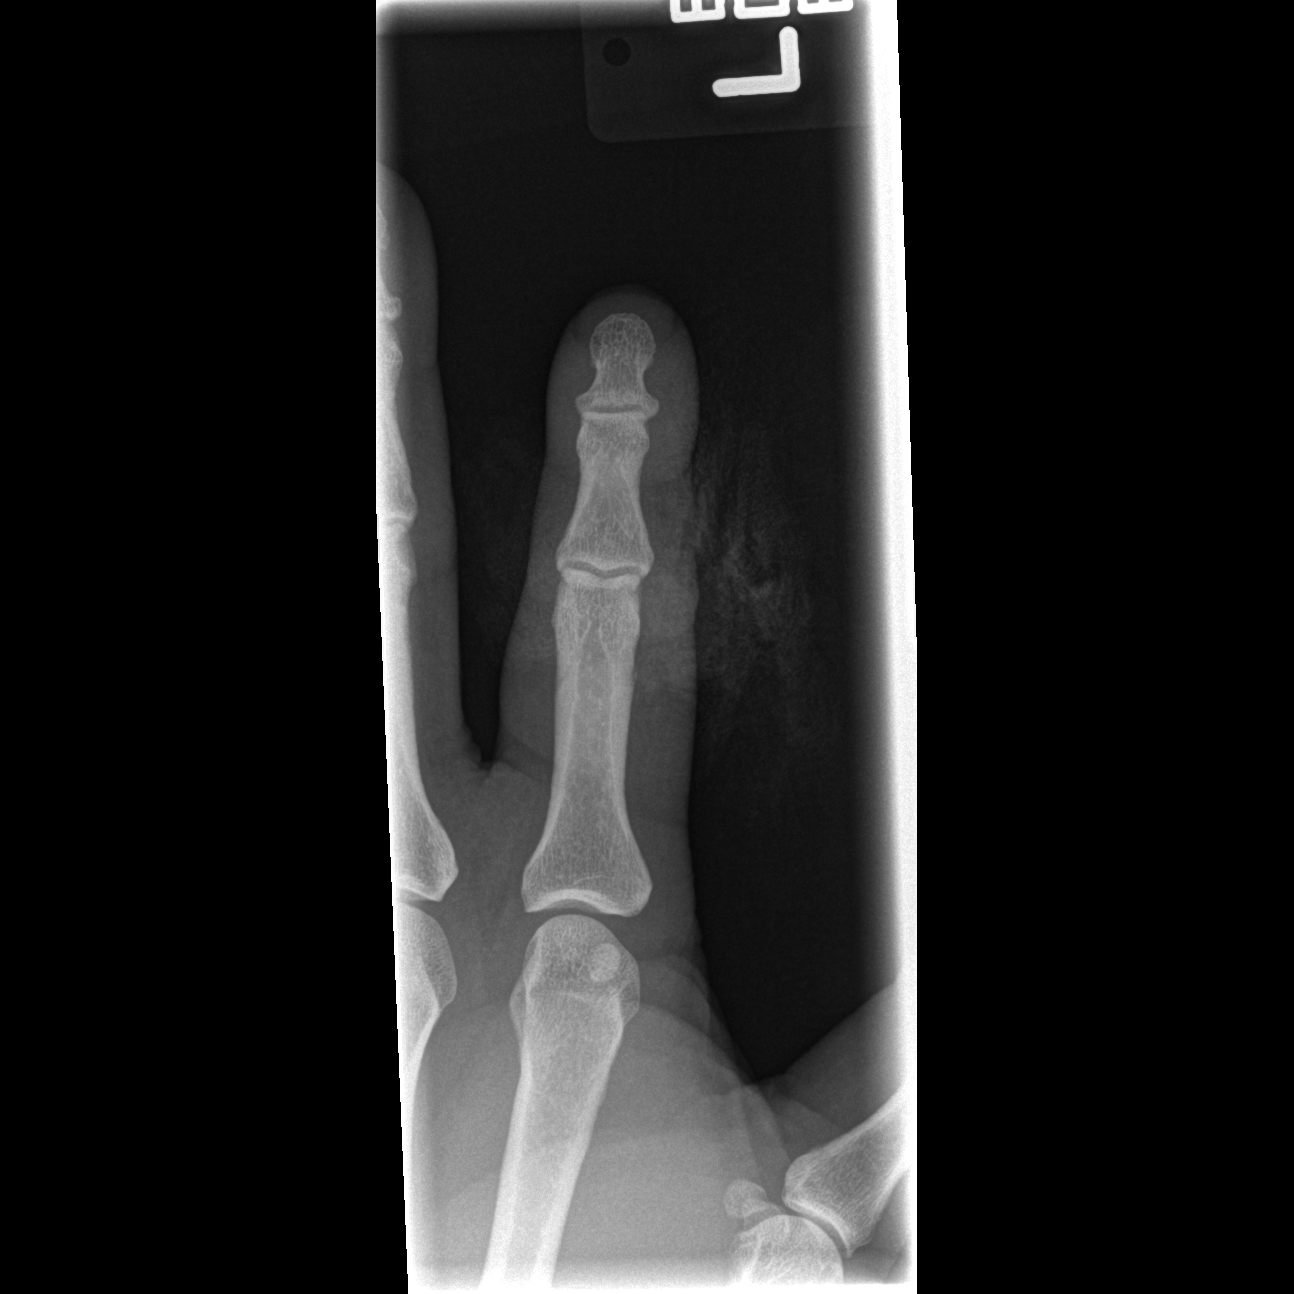

[x finger obl. left]
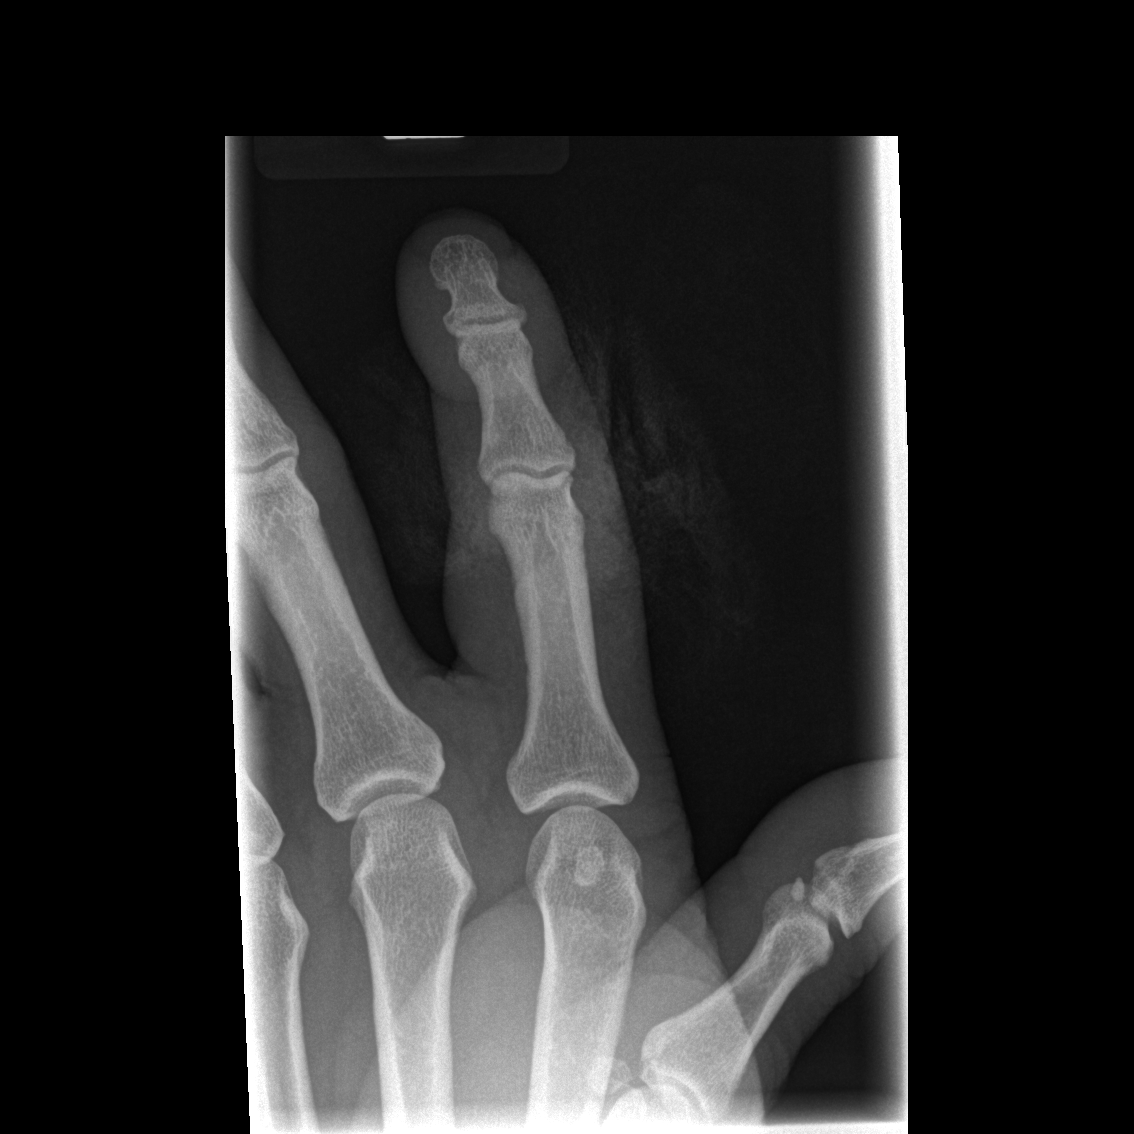

[x finger lateral left]
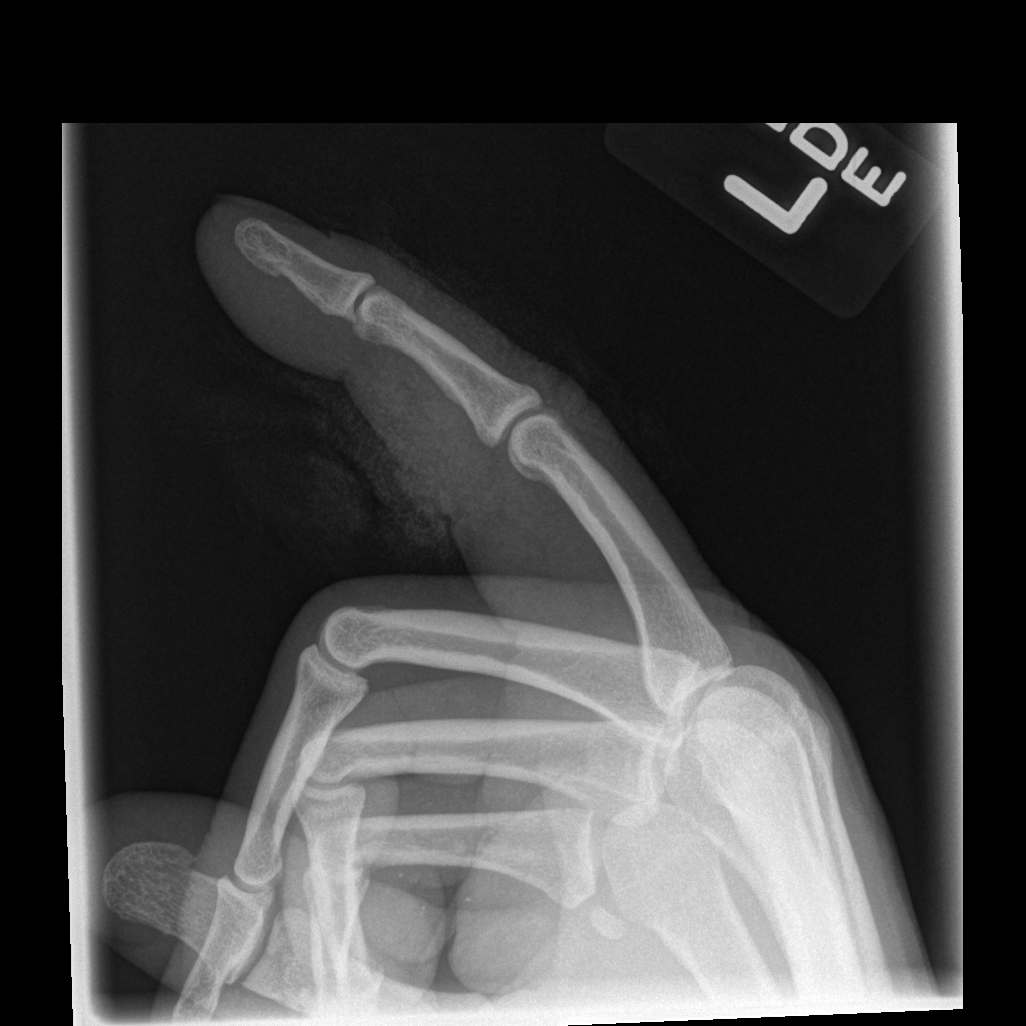

[3 of 3 positions shown; findings below may reference images not displayed]

FINDINGS: Irregularly of the soft tissues about the proximal interphalangeal
joint of the index finger consistent with the clinical history of
laceration. No evidence of acute fracture or malalignment. No
radiopaque foreign body.
IMPRESSION: No acute osseous injury or retained radiopaque foreign body.
